# Patient Record
Sex: Female | Born: 1995 | Race: White | Hispanic: No | Marital: Single | Smoking: Current every day smoker
Health system: Southern US, Community
[De-identification: ages and names within clinical notes are randomized; demographics above are authoritative.]

---
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
## 2016-10-26 ENCOUNTER — Emergency Department
Admission: EM | Admit: 2016-10-26 | Discharge: 2016-10-26 | Disposition: A | Discharge status: Home / Self Care | Payer: Self-pay | Attending: Emergency Medicine | Admitting: Emergency Medicine

## 2016-10-26 ENCOUNTER — Emergency Department: Payer: Self-pay

## 2016-10-26 DIAGNOSIS — N10 Acute tubulo-interstitial nephritis: Secondary | ICD-10-CM | POA: Insufficient documentation

## 2016-10-26 LAB — CBC WITH DIFFERENTIAL/PLATELET
BASOS ABS: 0 10*3/uL (ref 0–0.1)
BASOS PCT: 0 %
EOS ABS: 0 10*3/uL (ref 0–0.7)
Eosinophils Relative: 0 %
HEMATOCRIT: 34.9 % — AB (ref 35.0–47.0)
Hemoglobin: 11.9 g/dL — ABNORMAL LOW (ref 12.0–16.0)
Lymphocytes Relative: 14 %
Lymphs Abs: 1.9 10*3/uL (ref 1.0–3.6)
MCH: 29.7 pg (ref 26.0–34.0)
MCHC: 34.1 g/dL (ref 32.0–36.0)
MCV: 87.3 fL (ref 80.0–100.0)
MONO ABS: 1.4 10*3/uL — AB (ref 0.2–0.9)
Monocytes Relative: 10 %
NEUTROS ABS: 10.7 10*3/uL — AB (ref 1.4–6.5)
NEUTROS PCT: 76 %
Platelets: 156 10*3/uL (ref 150–440)
RBC: 4 MIL/uL (ref 3.80–5.20)
RDW: 12.8 % (ref 11.5–14.5)
WBC: 14.1 10*3/uL — ABNORMAL HIGH (ref 3.6–11.0)

## 2016-10-26 LAB — URINALYSIS, ROUTINE W REFLEX MICROSCOPIC
Bilirubin Urine: NEGATIVE
Glucose, UA: NEGATIVE mg/dL
Hgb urine dipstick: NEGATIVE
KETONES UR: 20 mg/dL — AB
Nitrite: NEGATIVE
PROTEIN: 30 mg/dL — AB
Specific Gravity, Urine: 1.023 (ref 1.005–1.030)
pH: 5 (ref 5.0–8.0)

## 2016-10-26 LAB — BASIC METABOLIC PANEL
ANION GAP: 9 (ref 5–15)
BUN: 7 mg/dL (ref 6–20)
CALCIUM: 8.5 mg/dL — AB (ref 8.9–10.3)
CO2: 22 mmol/L (ref 22–32)
CREATININE: 0.65 mg/dL (ref 0.44–1.00)
Chloride: 102 mmol/L (ref 101–111)
Glucose, Bld: 86 mg/dL (ref 65–99)
Potassium: 3.2 mmol/L — ABNORMAL LOW (ref 3.5–5.1)
Sodium: 133 mmol/L — ABNORMAL LOW (ref 135–145)

## 2016-10-26 LAB — PREGNANCY, URINE: PREG TEST UR: NEGATIVE

## 2016-10-26 MED ORDER — SODIUM CHLORIDE 0.9 % IV BOLUS (SEPSIS)
1000.0000 mL | Freq: Once | INTRAVENOUS | Status: AC
  Administered 2016-10-26: 1000 mL via INTRAVENOUS

## 2016-10-26 MED ORDER — CIPROFLOXACIN HCL 500 MG PO TABS: 500.0000 mg | ORAL_TABLET | Freq: Two times a day (BID) | ORAL | 0 refills | Status: AC

## 2016-10-26 MED ORDER — NAPROXEN 500 MG PO TABS: 500.0000 mg | ORAL_TABLET | Freq: Two times a day (BID) | ORAL | 0 refills | Status: DC

## 2016-10-26 MED ORDER — SODIUM CHLORIDE 0.9 % IV BOLUS (SEPSIS): 500.0000 mL | Freq: Once | INTRAVENOUS | Status: DC

## 2016-10-26 NOTE — ED Notes (Signed)
Patient transported to CT 

## 2016-10-26 NOTE — ED Notes (Signed)
Pt states she has left sided flank pain for 4 days. Pt states she has had chills and nausea. Pt states no vaginal discharge and no known hematuria. Pt states pain is independent of movement. Skin warm and dry, resps unlabored. Cap refill less than 2 seconds.

## 2016-10-26 NOTE — ED Triage Notes (Signed)
Pt reports that she has lower abd pain that radiates into left ribs - pain has been present for 2 days - reports fever max 102.5 - c/o vomiting, chills, hot flashes, generalized headache

## 2016-10-26 NOTE — ED Provider Notes (Signed)
[facility] Emergency Department Provider Note  ____________________________________________   None    (approximate)  I have reviewed the triage vital signs and the nursing notes.   HISTORY  Chief Complaint Back Pain    HPI Makayla Roman is a 21 y.o. female presents with complaints of left flank pain 4-5 days. Positive vomiting and nausea times one day only. Denies any dietary needed. Sexually active with no form of birth control. Denies any vaginal discharge. Denies any bloody urine. Or dysuria.   History reviewed. No pertinent past medical history.  There are no active problems to display for this patient.   History reviewed. No pertinent surgical history.  Prior to Admission medications   Medication Sig Start Date End Date Taking? Authorizing Provider  ciprofloxacin (CIPRO) 500 MG tablet Take 1 tablet (500 mg total) by mouth 2 (two) times daily. 10/26/16 11/05/16  Charmayne Sheer Emeline Simpson, PA-C  naproxen (NAPROSYN) 500 MG tablet Take 1 tablet (500 mg total) by mouth 2 (two) times daily with a meal. 10/26/16   Evangeline Dakin, PA-C    Allergies Patient has no known allergies.  No family history on file.  Social History Social History  Substance Use Topics  . Smoking status: Never Smoker  . Smokeless tobacco: Never Used  . Alcohol use No    Review of Systems Constitutional: No fever/chills Eyes: No visual changes. ENT: No sore throat. Cardiovascular: Denies chest pain. Respiratory: Denies shortness of breath. Gastrointestinal: No abdominal pain.  No nausea, no vomiting.  No diarrhea.  No constipation. Genitourinary: Negative for dysuria. Musculoskeletal: Positive for left flank pain. Skin: Negative for rash. Neurological: Negative for headaches, focal weakness or numbness.  10-point ROS otherwise negative.  ____________________________________________   PHYSICAL EXAM:  VITAL SIGNS: ED Triage Vitals  Enc Vitals Group     BP  10/26/16 1656 101/67     Pulse Rate 10/26/16 1656 (!) 107     Resp 10/26/16 1656 16     Temp 10/26/16 1656 99.3 F (37.4 C)     Temp Source 10/26/16 1656 Oral     SpO2 10/26/16 1656 98 %     Weight 10/26/16 1657 130 lb (59 kg)     Height 10/26/16 1657 5' (1.524 m)     Head Circumference --      Peak Flow --      Pain Score 10/26/16 1657 5     Pain Loc --      Pain Edu? --      Excl. in GC? --     Constitutional: Alert and oriented. Well appearing and in no acute distress. Mouth/Throat: Mucous membranes are moist.  Oropharynx non-erythematous. Neck: No stridor.   Cardiovascular: Normal rate, regular rhythm. Grossly normal heart sounds.  Good peripheral circulation. Respiratory: Normal respiratory effort.  No retractions. Lungs CTAB. Gastrointestinal: Soft and nontender. No distention. No abdominal bruits. Positive left CVA tenderness. Musculoskeletal: No lower extremity tenderness nor edema.  No joint effusions. Neurologic:  Normal speech and language. No gross focal neurologic deficits are appreciated. No gait instability. Skin:  Skin is warm, dry and intact. No rash noted. Psychiatric: Mood and affect are normal. Speech and behavior are normal.  ____________________________________________   LABS (all labs ordered are listed, but only abnormal results are displayed)  Labs Reviewed  URINALYSIS, ROUTINE W REFLEX MICROSCOPIC - Abnormal; Notable for the following:       Result Value   Color, Urine AMBER (*)    APPearance CLEAR (*)  Ketones, ur 20 (*)    Protein, ur 30 (*)    Leukocytes, UA SMALL (*)    Bacteria, UA RARE (*)    Squamous Epithelial / LPF 0-5 (*)    All other components within normal limits  BASIC METABOLIC PANEL - Abnormal; Notable for the following:    Sodium 133 (*)    Potassium 3.2 (*)    Calcium 8.5 (*)    All other components within normal limits  CBC WITH DIFFERENTIAL/PLATELET - Abnormal; Notable for the following:    WBC 14.1 (*)    Hemoglobin  11.9 (*)    HCT 34.9 (*)    Neutro Abs 10.7 (*)    Monocytes Absolute 1.4 (*)    All other components within normal limits  URINE CULTURE  PREGNANCY, URINE   ____________________________________________  EKG   ____________________________________________  RADIOLOGY  Positive for mild renal calculi. Positive for inflammation secondary to UTI. ____________________________________________   PROCEDURES  Procedure(s) performed: None  Procedures  Critical Care performed: No  ____________________________________________   INITIAL IMPRESSION / ASSESSMENT AND PLAN / ED COURSE  Pertinent labs & imaging results that were available during my care of the patient were reviewed by me and considered in my medical decision making (see chart for details).  Acute urinary tract infection\pyelonephritis early. Rx given for Cipro 500 mg twice a day and ibuprofen 800 mg 3 times a day. Patient follow-up PCP or return to ER with any worsening symptomology. Patient voices no other emergency medical complaints at this time.      ____________________________________________   FINAL CLINICAL IMPRESSION(S) / ED DIAGNOSES  Final diagnoses:  Pyelonephritis, acute      NEW MEDICATIONS STARTED DURING THIS VISIT:  Discharge Medication List as of 10/26/2016  8:48 PM    START taking these medications   Details  ciprofloxacin (CIPRO) 500 MG tablet Take 1 tablet (500 mg total) by mouth 2 (two) times daily., Starting Fri 10/26/2016, Until Mon 11/05/2016, Print    naproxen (NAPROSYN) 500 MG tablet Take 1 tablet (500 mg total) by mouth 2 (two) times daily with a meal., Starting Fri 10/26/2016, Print         Note:  This document was prepared using Dragon voice recognition software and may include unintentional dictation errors.   Evangeline Dakinharles M Vung Kush, PA-C 10/26/16 2106    Myrna Blazeravid Matthew Schaevitz, MD 10/27/16 [phone]

## 2016-10-29 LAB — URINE CULTURE: SPECIAL REQUESTS: NORMAL

## 2017-06-04 IMAGING — CT CT RENAL STONE PROTOCOL
3 of 4 series · 10 of 46 positions shown, 15 images · non-contrast
Comparison: None.

CLINICAL DATA: Lower abdominal pain radiating to the left ribs
present for 2 days with fevers, vomiting, and chills. Leukocytosis.
White blood cells in urine.

EXAM:
CT ABDOMEN AND PELVIS WITHOUT CONTRAST
TECHNIQUE: Multidetector CT imaging of the abdomen and pelvis was performed
following the standard protocol without IV contrast.

[Series 4: lung bases · axial · 0.71mm/px · z∈[+885,+985]mm · 6 of 30 slices shown, 11 images]
[im 5/30  soft-tissue]
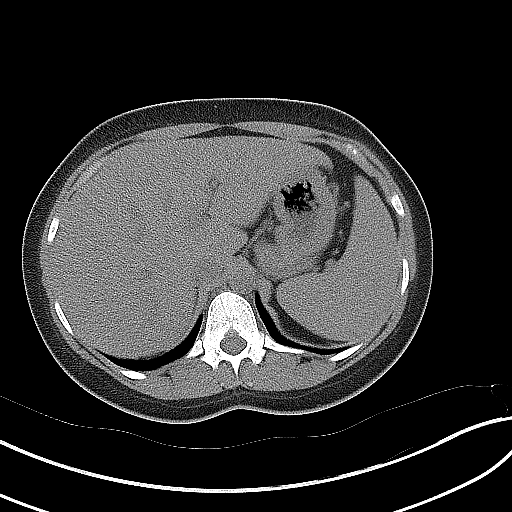
[im 5/30  bone]
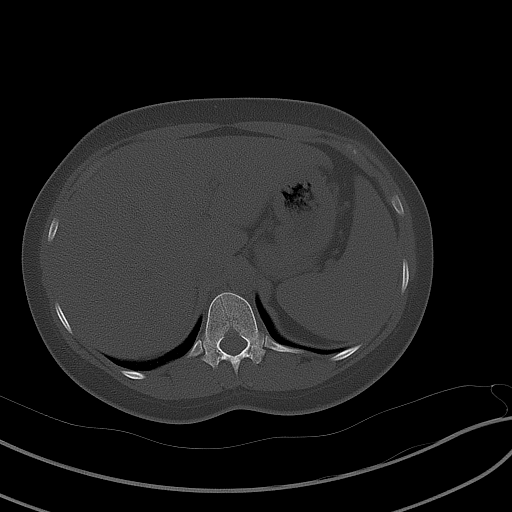
[im 9/30  soft-tissue]
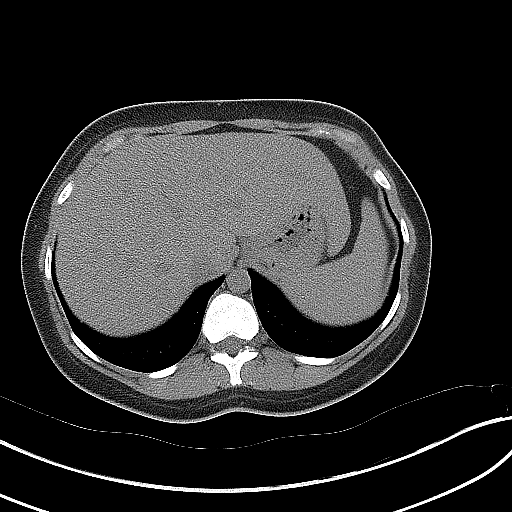
[im 13/30  soft-tissue]
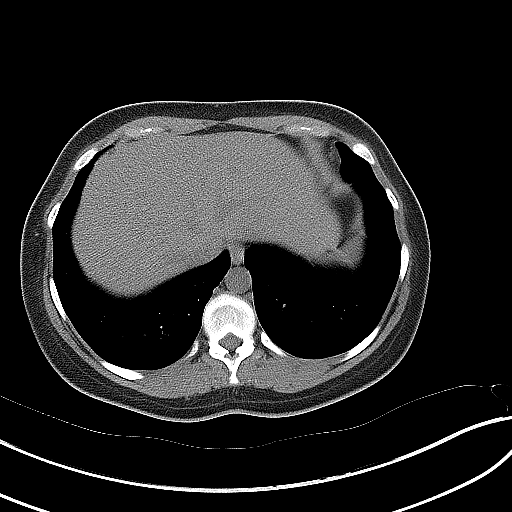
[im 13/30  lung]
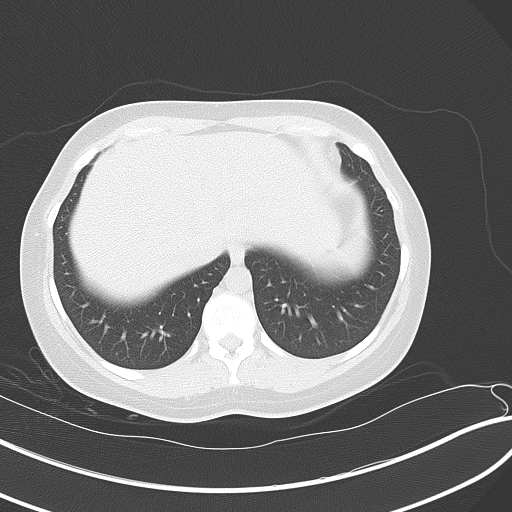
[im 17/30  soft-tissue]
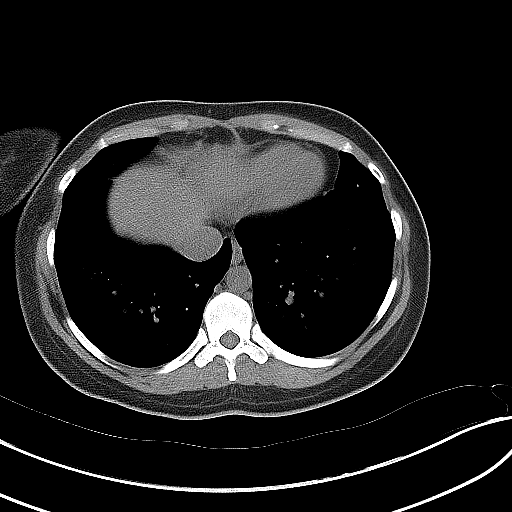
[im 17/30  lung]
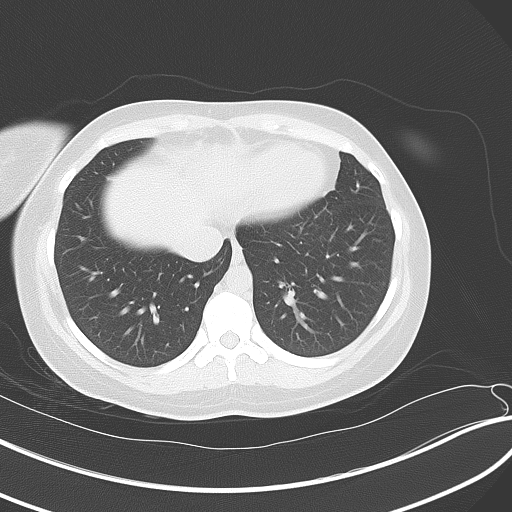
[im 21/30  soft-tissue]
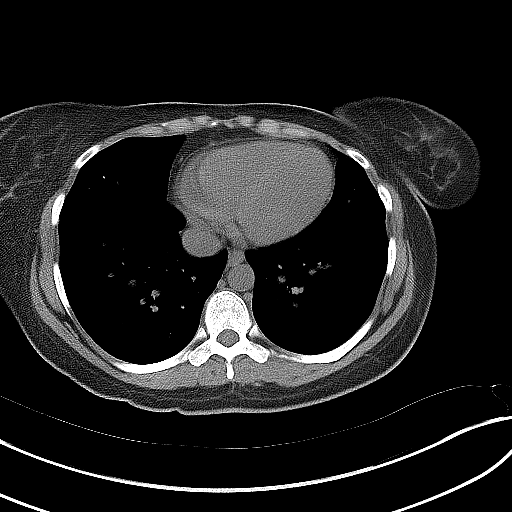
[im 21/30  lung]
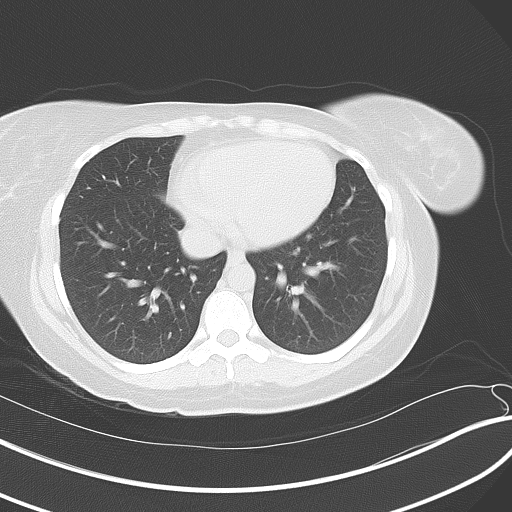
[im 25/30  soft-tissue]
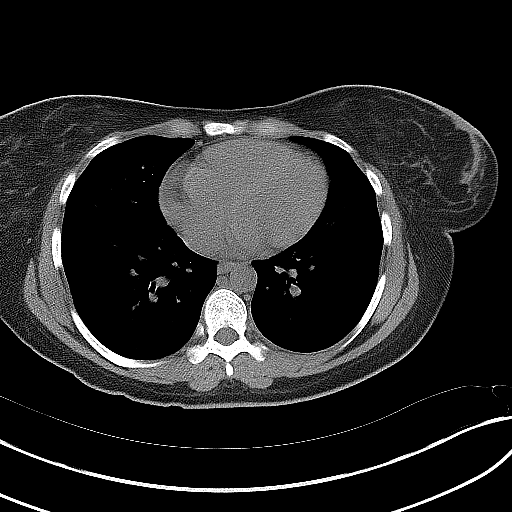
[im 25/30  lung]
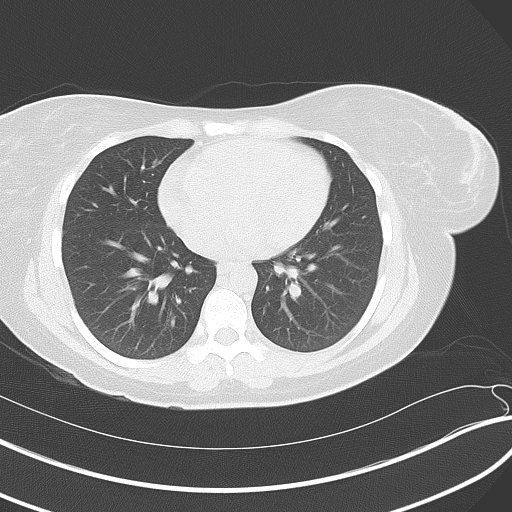

[Series 5: coronal · coronal · 0.65mm/px · 3 of 117 slices shown]
[im 39/117  soft-tissue]
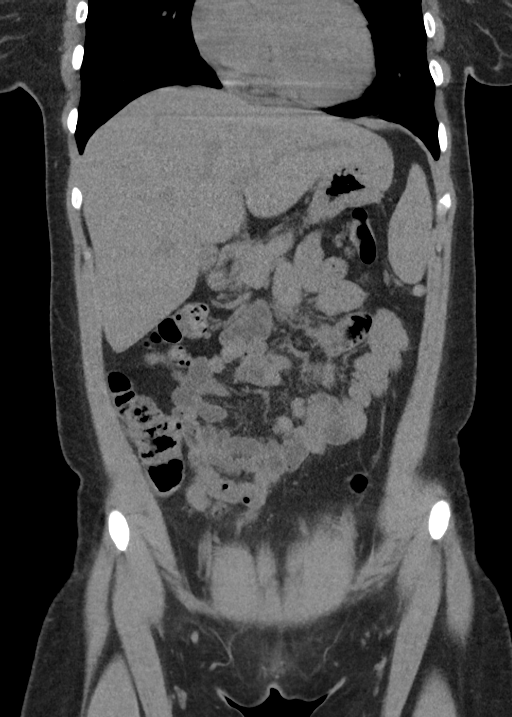
[im 52/117  soft-tissue]
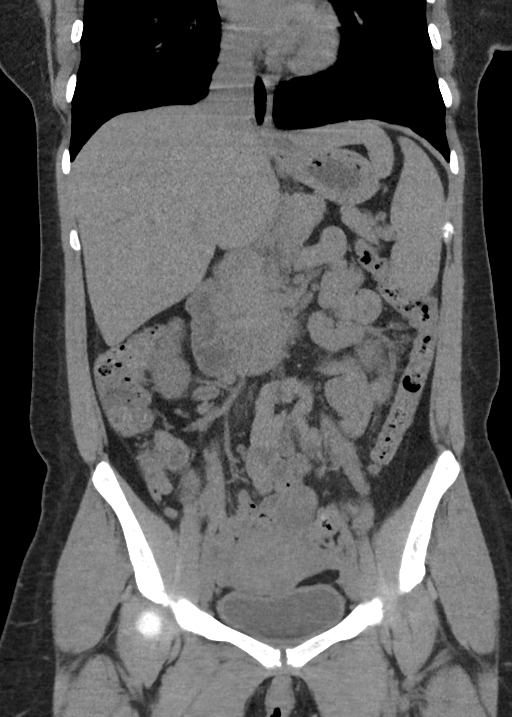
[im 65/117  soft-tissue]
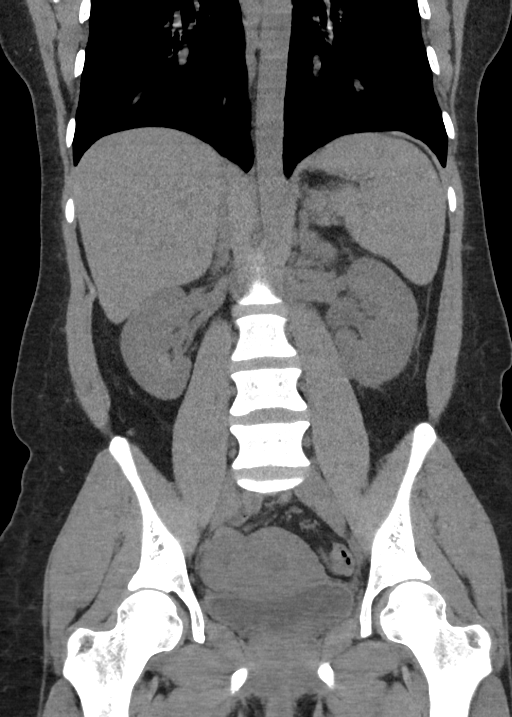

[Series 6: sagittal · sagittal · 0.53mm/px · 1 of 166 slices shown]
[im 56/166  soft-tissue]
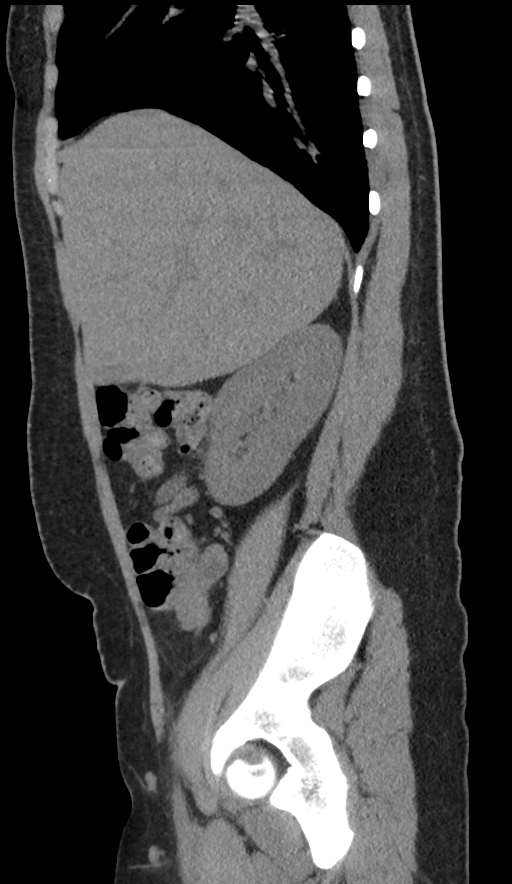

[10 of 46 positions shown; findings below may reference images not displayed]

FINDINGS: LOWER CHEST: Lung bases are clear. Included heart size is normal. No
pericardial effusion.

HEPATOBILIARY: Liver and gallbladder are normal.

PANCREAS: Normal.

SPLEEN: Normal.

ADRENALS/URINARY TRACT: There is mild asymmetric left renal
enlargement with perinephric fat stranding. No definite
nephrolithiasis although there is a punctate calculus in the left
hemipelvis either representing a small phlebolith or potentially a
tiny distal left ureteral stone. Ascending urinary tract infection
could also have this appearance of perinephric fat stranding such as
pyelonephritis and should be correlated. Urinary bladder is
partially distended and unremarkable. Normal adrenal glands.

STOMACH/BOWEL: The stomach, small and large bowel are normal in
course and caliber without inflammatory changes. Normal appendix.

VASCULAR/LYMPHATIC: Aortoiliac vessels are normal in course and
caliber. No lymphadenopathy by CT size criteria.

REPRODUCTIVE: Normal.

OTHER: No intraperitoneal free fluid or free air.

MUSCULOSKELETAL: Nonacute.
IMPRESSION: Mild left-sided nephromegaly with perinephric fat stranding
suspicious for urinary tract infection/pyelonephritis given history
of fever, leukocytosis, and wbc in urine. No definite obstructive
uropathy. There is a punctate calculus in the left hemipelvis which
could either represent a tiny phlebolith or ureteral stone though no
rbc in the urine. New no

## 2020-04-01 ENCOUNTER — Ambulatory Visit
Admission: RE | Admit: 2020-04-01 | Discharge: 2020-04-01 | Disposition: A | Discharge status: Home / Self Care | Payer: Medicaid Other | Source: Ambulatory Visit | Attending: Family Medicine | Admitting: Family Medicine

## 2020-04-01 ENCOUNTER — Other Ambulatory Visit: Payer: Self-pay

## 2020-04-01 VITALS — BP 103/69 | HR 108 | Temp 98.6°F | Resp 16

## 2020-04-01 DIAGNOSIS — R509 Fever, unspecified: Secondary | ICD-10-CM | POA: Diagnosis not present

## 2020-04-01 DIAGNOSIS — R05 Cough: Secondary | ICD-10-CM

## 2020-04-01 DIAGNOSIS — R0981 Nasal congestion: Secondary | ICD-10-CM | POA: Diagnosis not present

## 2020-04-01 DIAGNOSIS — H65193 Other acute nonsuppurative otitis media, bilateral: Secondary | ICD-10-CM

## 2020-04-01 DIAGNOSIS — R059 Cough, unspecified: Secondary | ICD-10-CM

## 2020-04-01 DIAGNOSIS — R000 Tachycardia, unspecified: Secondary | ICD-10-CM

## 2020-04-01 LAB — POC SARS CORONAVIRUS 2 AG -  ED: SARS Coronavirus 2 Ag: NEGATIVE

## 2020-04-01 MED ORDER — AMOXICILLIN 500 MG PO TABS: 500.0000 mg | ORAL_TABLET | Freq: Two times a day (BID) | ORAL | 0 refills | Status: AC

## 2020-04-01 NOTE — Discharge Instructions (Signed)
Your COVID test is pending.  You should self quarantine until the test result is back.    Take Tylenol as needed for fever or discomfort.  Rest and keep yourself hydrated.    Go to the emergency department if you develop shortness of breath, severe diarrhea, high fever not relieved by Tylenol or ibuprofen, or other concerning symptoms.    You have an ear infection.  I have sent in amoxicillin to your pharmacy. If you are not feeling better over the next 2 days, follow up with primary care or with this office

## 2020-04-01 NOTE — ED Triage Notes (Signed)
C/o productive cough, congestion, and fever x4 days.

## 2020-04-01 NOTE — ED Provider Notes (Signed)
[facility]   [id number] 04/01/20 Arrival Time: 1103   CC: COVID symptoms  SUBJECTIVE: History from: patient.  Makayla Roman is a 24 y.o. female who presents with abrupt onset of nasal congestion, PND, fever, bilateral ear pain, productive cough for the last 4 days. Denies sick exposure to COVID, flu or strep. Denies recent travel. Has tried ibuprofen with temporary fever relief. There are no aggravating symptoms. Denies previous symptoms in the past.   Denies sinus pain, rhinorrhea, sore throat, SOB, wheezing, chest pain, nausea, changes in bowel or bladder habits.    ROS: As per HPI.  All other pertinent ROS negative.     History reviewed. No pertinent past medical history. History reviewed. No pertinent surgical history. No Known Allergies No current facility-administered medications on file prior to encounter.   Current Outpatient Medications on File Prior to Encounter  Medication Sig Dispense Refill   naproxen (NAPROSYN) 500 MG tablet Take 1 tablet (500 mg total) by mouth 2 (two) times daily with a meal. 60 tablet 0   Social History   Socioeconomic History   Marital status: Single    Spouse name: Not on file   Number of children: Not on file   Years of education: Not on file   Highest education level: Not on file  Occupational History   Not on file  Tobacco Use   Smoking status: Current Every Day Smoker    Packs/day: 1.00   Smokeless tobacco: Never Used  Substance and Sexual Activity   Alcohol use: No   Drug use: Not on file   Sexual activity: Not on file  Other Topics Concern   Not on file  Social History Narrative   Not on file   Social Determinants of Health   Financial Resource Strain:    Difficulty of Paying Living Expenses:   Food Insecurity:    Worried About Charity fundraiser in the Last Year:    Arboriculturist in the Last Year:   Transportation Needs:    Film/video editor (Medical):    Lack of  Transportation (Non-Medical):   Physical Activity:    Days of Exercise per Week:    Minutes of Exercise per Session:   Stress:    Feeling of Stress :   Social Connections:    Frequency of Communication with Friends and Family:    Frequency of Social Gatherings with Friends and Family:    Attends Religious Services:    Active Member of Clubs or Organizations:    Attends Music therapist:    Marital Status:   Intimate Partner Violence:    Fear of Current or Ex-Partner:    Emotionally Abused:    Physically Abused:    Sexually Abused:    No family history on file.  OBJECTIVE:  Vitals:   04/01/20 1112  BP: 103/69  Pulse: (!) 108  Resp: 16  Temp: 98.6 F (37 C)  SpO2: 94%     General appearance: alert; appears fatigued, but nontoxic; speaking in full sentences and tolerating own secretions HEENT: NCAT; Ears: EACs clear, bilateral TMs erythematous, bulging, with effusion; Eyes: PERRL.  EOM grossly intact. Sinuses: nontender; Nose: nares patent without rhinorrhea, Throat: oropharynx clear, tonsils non erythematous or enlarged, uvula midline  Neck: supple without LAD Lungs: unlabored respirations, symmetrical air entry; cough: moderate; no respiratory distress; CTAB Heart: regular rate and rhythm.  Radial pulses 2+ symmetrical bilaterally Skin: warm and dry Psychological: alert and cooperative; normal mood and affect  LABS:  Results for orders placed or performed during the hospital encounter of 04/01/20 (from the past 24 hour(s))  POC SARS Coronavirus 2 Ag-ED - Nasal Swab (BD Veritor Kit)     Status: None   Collection Time: 04/01/20 11:41 AM  Result Value Ref Range   SARS Coronavirus 2 Ag Negative Negative     ASSESSMENT & PLAN:  1. Other non-recurrent acute nonsuppurative otitis media of both ears   2. Cough   3. Nasal congestion   4. Fever, unspecified fever cause   5. Tachycardia     Meds ordered this encounter  Medications    amoxicillin (AMOXIL) 500 MG tablet    Sig: Take 1 tablet (500 mg total) by mouth 2 (two) times daily for 10 days.    Dispense:  20 tablet    Refill:  0    Order Specific Question:   Supervising Provider    Answer:   Chase Picket [id number]   Bilateral OM Cough Nasal Congestion Fever Tachycardia  Prescribed Amoxicillin Take as directed and to completion Rapid Covid negative Send out Covid ordered It will take between 1-2 days for test results.   Someone will contact you regarding abnormal results.    Patient should remain in quarantine until they have received Covid results.  If negative you may resume normal activities (go back to work/school) while practicing hand hygiene, social distance, and mask wearing.  If positive, patient should remain in quarantine for 10 days from symptom onset AND greater than 72 hours after symptoms resolution (absence of fever without the use of fever-reducing medication and improvement in respiratory symptoms), whichever is longer Get plenty of rest and push fluids Use OTC zyrtec for nasal congestion, runny nose, and/or sore throat Use OTC flonase for nasal congestion and runny nose Use medications daily for symptom relief Use OTC medications like ibuprofen or tylenol as needed fever or pain Call or go to the ED if you have any new or worsening symptoms such as fever, worsening cough, shortness of breath, chest tightness, chest pain, turning blue, changes in mental status.  Reviewed expectations re: course of current medical issues. Questions answered. Outlined signs and symptoms indicating need for more acute intervention. Patient verbalized understanding. After Visit Summary given.         Faustino Congress, NP 04/01/20 1720

## 2020-04-02 LAB — SARS-COV-2, NAA 2 DAY TAT

## 2020-04-02 LAB — NOVEL CORONAVIRUS, NAA: SARS-CoV-2, NAA: NOT DETECTED

## 2020-10-25 ENCOUNTER — Other Ambulatory Visit: Payer: Self-pay

## 2020-10-25 ENCOUNTER — Ambulatory Visit
Admission: EM | Admit: 2020-10-25 | Discharge: 2020-10-25 | Disposition: A | Discharge status: Home / Self Care | Payer: Medicaid Other | Attending: Family Medicine | Admitting: Family Medicine

## 2020-10-25 DIAGNOSIS — N3001 Acute cystitis with hematuria: Secondary | ICD-10-CM | POA: Diagnosis present

## 2020-10-25 DIAGNOSIS — R1030 Lower abdominal pain, unspecified: Secondary | ICD-10-CM | POA: Insufficient documentation

## 2020-10-25 LAB — POCT URINALYSIS DIP (MANUAL ENTRY)
Bilirubin, UA: NEGATIVE
Blood, UA: NEGATIVE
Glucose, UA: NEGATIVE mg/dL
Ketones, POC UA: NEGATIVE mg/dL
Nitrite, UA: POSITIVE — AB
Protein Ur, POC: NEGATIVE mg/dL
Spec Grav, UA: >=1.03 — AB (ref 1.010–1.025)
Urobilinogen, UA: 0.2 E.U./dL
pH, UA: 5.5 (ref 5.0–8.0)

## 2020-10-25 LAB — POCT URINE PREGNANCY: Preg Test, Ur: NEGATIVE

## 2020-10-25 MED ORDER — NITROFURANTOIN MONOHYD MACRO 100 MG PO CAPS: 100.0000 mg | ORAL_CAPSULE | Freq: Two times a day (BID) | ORAL | 0 refills | Status: DC

## 2020-10-25 NOTE — ED Provider Notes (Signed)
Renaldo Fiddler    CSN: [id number] Arrival date & time: 10/25/20  0856      History   Chief Complaint Chief Complaint  Patient presents with  . Abdominal Pain    HPI Makayla Roman is a 25 y.o. female.   Patient is a 25 year old female who presents today with complaints of lower abdominal discomfort.  This is more to the suprapubic area and right pelvic area.  Describes the pain as deep, sharp that started this morning during a bowel movement.  After bowel movement the pain subsided within 10 to 15 minutes.  Mild pain at this time.  No fevers, flank pain, dysuria, hematuria or urinary frequency.  No vaginal discharge or concern for STDs. Patient's last menstrual period was 10/04/2020.      History reviewed. No pertinent past medical history.  There are no problems to display for this patient.   Past Surgical History:  Procedure Laterality Date  . CESAREAN SECTION  2016    OB History   No obstetric history on file.      Home Medications    Prior to Admission medications   Medication Sig Start Date End Date Taking? Authorizing Provider  nitrofurantoin, macrocrystal-monohydrate, (MACROBID) 100 MG capsule Take 1 capsule (100 mg total) by mouth 2 (two) times daily. 10/25/20  Yes Janace Aris, NP    Family History Family History  Adopted: Yes  Family history unknown: Yes    Social History Social History   Tobacco Use  . Smoking status: Current Every Day Smoker    Packs/day: 1.00  . Smokeless tobacco: Never Used  Vaping Use  . Vaping Use: Never used  Substance Use Topics  . Alcohol use: No  . Drug use: Never     Allergies   Patient has no known allergies.   Review of Systems Review of Systems   Physical Exam Triage Vital Signs ED Triage Vitals  Enc Vitals Group     BP 10/25/20 0910 108/73     Pulse Rate 10/25/20 0910 80     Resp 10/25/20 0910 18     Temp 10/25/20 0910 98.2 F (36.8 C)     Temp Source 10/25/20 0910 Oral      SpO2 10/25/20 0910 98 %     Weight --      Height --      Head Circumference --      Peak Flow --      Pain Score 10/25/20 0908 2     Pain Loc --      Pain Edu? --      Excl. in GC? --    No data found.  Updated Vital Signs BP 108/73 (BP Location: Left Arm)   Pulse 80   Temp 98.2 F (36.8 C) (Oral)   Resp 18   LMP 10/04/2020   SpO2 98%   Visual Acuity Right Eye Distance:   Left Eye Distance:   Bilateral Distance:    Right Eye Near:   Left Eye Near:    Bilateral Near:     Physical Exam Vitals and nursing note reviewed.  Constitutional:      General: She is not in acute distress.    Appearance: Normal appearance. She is not ill-appearing, toxic-appearing or diaphoretic.  HENT:     Head: Normocephalic.  Eyes:     Conjunctiva/sclera: Conjunctivae normal.  Pulmonary:     Effort: Pulmonary effort is normal.  Abdominal:     General: There is no distension.  Palpations: Abdomen is soft.     Tenderness: There is abdominal tenderness. There is no guarding or rebound.       Comments: TTP  Musculoskeletal:        General: Normal range of motion.     Cervical back: Normal range of motion.  Skin:    General: Skin is warm and dry.     Findings: No rash.  Neurological:     Mental Status: She is alert.  Psychiatric:        Mood and Affect: Mood normal.      UC Treatments / Results  Labs (all labs ordered are listed, but only abnormal results are displayed) Labs Reviewed  POCT URINALYSIS DIP (MANUAL ENTRY) - Abnormal; Notable for the following components:      Result Value   Clarity, UA cloudy (*)    Spec Grav, UA >=1.030 (*)    Nitrite, UA Positive (*)    Leukocytes, UA Trace (*)    All other components within normal limits  URINE CULTURE  POCT URINE PREGNANCY    EKG   Radiology No results found.  Procedures Procedures (including critical care time)  Medications Ordered in UC Medications - No data to display  Initial Impression / Assessment  and Plan / UC Course  I have reviewed the triage vital signs and the nursing notes.  Pertinent labs & imaging results that were available during my care of the patient were reviewed by me and considered in my medical decision making (see chart for details).     Lower abdominal pain with acute cystitis Urine with trace leuks, positive nitrates and cloudy Treating with Macrobid.  Sending for culture.  Recommend push fluids. Follow up as needed for continued or worsening symptoms  Final Clinical Impressions(s) / UC Diagnoses   Final diagnoses:  Lower abdominal pain  Acute cystitis with hematuria     Discharge Instructions     You have a urinary tract infection Take the antibiotics as prescribed Drink plenty of water.  Follow up as needed for continued or worsening symptoms      ED Prescriptions    Medication Sig Dispense Auth. Provider   nitrofurantoin, macrocrystal-monohydrate, (MACROBID) 100 MG capsule Take 1 capsule (100 mg total) by mouth 2 (two) times daily. 10 capsule Dahlia Byes A, NP     PDMP not reviewed this encounter.   Janace Aris, NP 10/25/20 1004

## 2020-10-25 NOTE — ED Triage Notes (Signed)
Pt reports with deep, sharp abdominal pain starting this morning during BM.  Lower abdominal/uterine area.  No fevers, +TTP, no discharge, no urinary s/s.  No concern for STDs. Pain started easing about 10-15 min after BM.

## 2020-10-25 NOTE — Discharge Instructions (Addendum)
You have a urinary tract infection Take the antibiotics as prescribed Drink plenty of water.  Follow up as needed for continued or worsening symptoms

## 2020-10-27 LAB — URINE CULTURE: Culture: >=100000 — AB

## 2020-10-28 ENCOUNTER — Ambulatory Visit
Admission: EM | Admit: 2020-10-28 | Discharge: 2020-10-28 | Disposition: A | Discharge status: Home / Self Care | Payer: Medicaid Other | Attending: Family Medicine | Admitting: Family Medicine

## 2020-10-28 ENCOUNTER — Other Ambulatory Visit: Payer: Self-pay

## 2020-10-28 DIAGNOSIS — Z1152 Encounter for screening for COVID-19: Secondary | ICD-10-CM

## 2020-10-28 NOTE — ED Triage Notes (Signed)
Pt reports having fever (99.0-101.0), fatigue, body aches, congestion x 3 days. She reports exposed to covid 9 days ago. Pt requesting covid test only.

## 2020-10-30 LAB — NOVEL CORONAVIRUS, NAA: SARS-CoV-2, NAA: DETECTED — AB

## 2020-10-30 LAB — SARS-COV-2, NAA 2 DAY TAT

## 2020-11-03 ENCOUNTER — Other Ambulatory Visit: Payer: Medicaid Other

## 2020-11-03 ENCOUNTER — Ambulatory Visit: Payer: Self-pay

## 2022-10-18 ENCOUNTER — Ambulatory Visit
Admission: RE | Admit: 2022-10-18 | Discharge: 2022-10-18 | Disposition: A | Discharge status: Home / Self Care | Payer: Medicaid Other | Source: Ambulatory Visit | Attending: Urgent Care | Admitting: Urgent Care

## 2022-10-18 VITALS — BP 111/74 | HR 113 | Temp 100.4°F | Resp 16

## 2022-10-18 DIAGNOSIS — K1121 Acute sialoadenitis: Secondary | ICD-10-CM | POA: Diagnosis not present

## 2022-10-18 MED ORDER — AMOXICILLIN-POT CLAVULANATE 875-125 MG PO TABS: 1.0000 | ORAL_TABLET | Freq: Two times a day (BID) | ORAL | 0 refills | Status: DC

## 2022-10-18 NOTE — ED Triage Notes (Signed)
Pt. Presents to UC w/ c/o right ear swelling and pain for the past 3 days.

## 2022-10-18 NOTE — ED Provider Notes (Addendum)
Roderic Palau    CSN: [id number] Arrival date & time: 10/18/22  1558      History   Chief Complaint Chief Complaint  Patient presents with   Otalgia    HPI Makayla Roman is a 27 y.o. female.    Otalgia   Patient presents to urgent care with complaint of right ear swelling and pain x 3 days.  She presents with elevated temperature of 100.4 F and elevated heart rate of 113 bpm.  She endorses jaw pain when she chews as well as pain on the external face below her ear with palpation.  History reviewed. No pertinent past medical history.  There are no problems to display for this patient.   Past Surgical History:  Procedure Laterality Date   CESAREAN SECTION  2016    OB History   No obstetric history on file.      Home Medications    Prior to Admission medications   Medication Sig Start Date End Date Taking? Authorizing Provider  nitrofurantoin, macrocrystal-monohydrate, (MACROBID) 100 MG capsule Take 1 capsule (100 mg total) by mouth 2 (two) times daily. 10/25/20   Orvan July, NP    Family History Family History  Adopted: Yes  Family history unknown: Yes    Social History Social History   Tobacco Use   Smoking status: Every Day    Packs/day: 1.00    Types: Cigarettes   Smokeless tobacco: Never  Vaping Use   Vaping Use: Never used  Substance Use Topics   Alcohol use: No   Drug use: Never     Allergies   Patient has no known allergies.   Review of Systems Review of Systems  HENT:  Positive for ear pain.      Physical Exam Triage Vital Signs ED Triage Vitals  Enc Vitals Group     BP 10/18/22 1626 111/74     Pulse Rate 10/18/22 1626 (!) 113     Resp 10/18/22 1626 16     Temp 10/18/22 1626 (!) 100.4 F (38 C)     Temp src --      SpO2 10/18/22 1626 96 %     Weight --      Height --      Head Circumference --      Peak Flow --      Pain Score 10/18/22 1627 4     Pain Loc --      Pain Edu? --      Excl. in [place]? --     No data found.  Updated Vital Signs BP 111/74   Pulse (!) 113   Temp (!) 100.4 F (38 C)   Resp 16   LMP 10/07/2022 (Exact Date)   SpO2 96%   Visual Acuity Right Eye Distance:   Left Eye Distance:   Bilateral Distance:    Right Eye Near:   Left Eye Near:    Bilateral Near:     Physical Exam Vitals reviewed.  Constitutional:      Appearance: Normal appearance.  HENT:     Head: Normocephalic.     Jaw: Tenderness, swelling and pain on movement present.      Ears:     Comments: Right EAC is erythematous near the TM.  TM appears WNL. Skin:    General: Skin is warm and dry.  Neurological:     General: No focal deficit present.     Mental Status: She is alert and oriented to person, place, and  time.  Psychiatric:        Mood and Affect: Mood normal.        Behavior: Behavior normal.      UC Treatments / Results  Labs (all labs ordered are listed, but only abnormal results are displayed) Labs Reviewed - No data to display  EKG   Radiology No results found.  Procedures Procedures (including critical care time)  Medications Ordered in UC Medications - No data to display  Initial Impression / Assessment and Plan / UC Course  I have reviewed the triage vital signs and the nursing notes.  Pertinent labs & imaging results that were available during my care of the patient were reviewed by me and considered in my medical decision making (see chart for details).   Symptoms, including fever, are consistent with infection of the salivary gland on the right side of her face.  She reports no symptoms that would imply sinusitis. Will treat empirically with antibiotic augmentin.   Final Clinical Impressions(s) / UC Diagnoses   Final diagnoses:  None   Discharge Instructions   None    ED Prescriptions   None    PDMP not reviewed this encounter.   Rose Phi, [place] 10/18/22 1652    Rose Phi, [place] 10/18/22 1655

## 2022-10-18 NOTE — Discharge Instructions (Addendum)
Follow up here or with your primary care provider if your symptoms are worsening or not improving with treatment.     

## 2024-10-21 ENCOUNTER — Emergency Department

## 2024-10-21 ENCOUNTER — Emergency Department
Admission: EM | Admit: 2024-10-21 | Discharge: 2024-10-21 | Disposition: A | Discharge status: Home / Self Care | Attending: Emergency Medicine | Admitting: Emergency Medicine

## 2024-10-21 DIAGNOSIS — J101 Influenza due to other identified influenza virus with other respiratory manifestations: Secondary | ICD-10-CM | POA: Insufficient documentation

## 2024-10-21 DIAGNOSIS — J181 Lobar pneumonia, unspecified organism: Secondary | ICD-10-CM | POA: Insufficient documentation

## 2024-10-21 DIAGNOSIS — R197 Diarrhea, unspecified: Secondary | ICD-10-CM | POA: Insufficient documentation

## 2024-10-21 DIAGNOSIS — J189 Pneumonia, unspecified organism: Secondary | ICD-10-CM

## 2024-10-21 DIAGNOSIS — R112 Nausea with vomiting, unspecified: Secondary | ICD-10-CM

## 2024-10-21 DIAGNOSIS — E876 Hypokalemia: Secondary | ICD-10-CM | POA: Diagnosis not present

## 2024-10-21 DIAGNOSIS — D72829 Elevated white blood cell count, unspecified: Secondary | ICD-10-CM | POA: Diagnosis not present

## 2024-10-21 DIAGNOSIS — R059 Cough, unspecified: Secondary | ICD-10-CM | POA: Diagnosis present

## 2024-10-21 LAB — CBC WITH DIFFERENTIAL/PLATELET
Abs Immature Granulocytes: 0.08 K/uL — ABNORMAL HIGH (ref 0.00–0.07)
Basophils Absolute: 0 K/uL (ref 0.0–0.1)
Basophils Relative: 0 %
Eosinophils Absolute: 0 K/uL (ref 0.0–0.5)
Eosinophils Relative: 0 %
HCT: 38.6 % (ref 36.0–46.0)
Hemoglobin: 14.1 g/dL (ref 12.0–15.0)
Immature Granulocytes: 1 %
Lymphocytes Relative: 14 %
Lymphs Abs: 2 K/uL (ref 0.7–4.0)
MCH: 31.7 pg (ref 26.0–34.0)
MCHC: 36.5 g/dL — ABNORMAL HIGH (ref 30.0–36.0)
MCV: 86.7 fL (ref 80.0–100.0)
Monocytes Absolute: 1 K/uL (ref 0.1–1.0)
Monocytes Relative: 7 %
Neutro Abs: 11.6 K/uL — ABNORMAL HIGH (ref 1.7–7.7)
Neutrophils Relative %: 78 %
Platelets: 112 K/uL — ABNORMAL LOW (ref 150–400)
RBC: 4.45 MIL/uL (ref 3.87–5.11)
RDW: 12.3 % (ref 11.5–15.5)
Smear Review: NORMAL
WBC: 15 K/uL — ABNORMAL HIGH (ref 4.0–10.5)
nRBC: 0 % (ref 0.0–0.2)

## 2024-10-21 LAB — COMPREHENSIVE METABOLIC PANEL WITH GFR
ALT: 18 U/L (ref 0–44)
AST: 19 U/L (ref 15–41)
Albumin: 4.1 g/dL (ref 3.5–5.0)
Alkaline Phosphatase: 70 U/L (ref 38–126)
Anion gap: 17 — ABNORMAL HIGH (ref 5–15)
BUN: 8 mg/dL (ref 6–20)
CO2: 22 mmol/L (ref 22–32)
Calcium: 9.9 mg/dL (ref 8.9–10.3)
Chloride: 94 mmol/L — ABNORMAL LOW (ref 98–111)
Creatinine, Ser: 0.58 mg/dL (ref 0.44–1.00)
GFR, Estimated: >60 mL/min
Glucose, Bld: 97 mg/dL (ref 70–99)
Potassium: 2.9 mmol/L — ABNORMAL LOW (ref 3.5–5.1)
Sodium: 132 mmol/L — ABNORMAL LOW (ref 135–145)
Total Bilirubin: 0.9 mg/dL (ref 0.0–1.2)
Total Protein: 7.6 g/dL (ref 6.5–8.1)

## 2024-10-21 LAB — RESP PANEL BY RT-PCR (RSV, FLU A&B, COVID)  RVPGX2
Influenza A by PCR: POSITIVE — AB
Influenza B by PCR: NEGATIVE
Resp Syncytial Virus by PCR: NEGATIVE
SARS Coronavirus 2 by RT PCR: NEGATIVE

## 2024-10-21 LAB — LIPASE, BLOOD: Lipase: 16 U/L (ref 11–51)

## 2024-10-21 MED ORDER — POTASSIUM CHLORIDE 10 MEQ/100ML IV SOLN
10.0000 meq | Freq: Once | INTRAVENOUS | Status: DC
  Filled 2024-10-21: qty 100

## 2024-10-21 MED ORDER — ONDANSETRON 4 MG PO TBDP
4.0000 mg | ORAL_TABLET | Freq: Once | ORAL | Status: AC
  Administered 2024-10-21: 4 mg via ORAL
  Filled 2024-10-21: qty 1

## 2024-10-21 MED ORDER — AZITHROMYCIN 250 MG PO TABS: ORAL_TABLET | ORAL | 0 refills | Status: AC

## 2024-10-21 MED ORDER — CEFDINIR 300 MG PO CAPS: 300.0000 mg | ORAL_CAPSULE | Freq: Two times a day (BID) | ORAL | 0 refills | Status: AC

## 2024-10-21 MED ORDER — SODIUM CHLORIDE 0.9 % IV BOLUS
1000.0000 mL | Freq: Once | INTRAVENOUS | Status: AC
  Administered 2024-10-21: 1000 mL via INTRAVENOUS

## 2024-10-21 MED ORDER — POTASSIUM CHLORIDE 20 MEQ PO PACK
40.0000 meq | PACK | Freq: Two times a day (BID) | ORAL | Status: DC
  Administered 2024-10-21: 40 meq via ORAL
  Filled 2024-10-21: qty 2

## 2024-10-21 NOTE — ED Provider Triage Note (Signed)
 Emergency Medicine Provider Triage Evaluation Note  Makayla Roman , a 29 y.o. female  was evaluated in triage.  Pt complains of nausea, vomiting and diarrhea. Fever, chills.  Recently prescribed amoxil  for otitis.   Review of Systems  Positive: Vomiting, diarrhea, otitis.  Negative: No abd pain  Physical Exam  BP 129/76   Pulse 87   Temp 98.2 F (36.8 C) (Oral)   Resp 18   Ht 5' (1.524 m)   Wt 61.2 kg   LMP 10/18/2024 (Approximate)   SpO2 95%   BMI 26.37 kg/m  Gen:   Awake, no distress  alert Resp:  Normal effort clear billaterally MSK:   Moves extremities without difficulty  Other:    Medical Decision Making  Medically screening exam initiated at 10:23 AM.  Appropriate orders placed.  Makayla Roman was informed that the remainder of the evaluation will be completed by another provider, this initial triage assessment does not replace that evaluation, and the importance of remaining in the ED until their evaluation is complete.     Saunders Shona CROME, PA-C 10/21/24 1025

## 2024-10-21 NOTE — ED Provider Notes (Signed)
 "  [facility] Provider Note    Event Date/Time   First MD Initiated Contact with Patient 10/21/24 1029     (approximate)   History   Emesis   HPI  Makayla Roman is a 29 y.o. female who presents today for evaluation of nausea, vomiting, diarrhea for the past 6 days.  Reports that her husband was sick with similar symptoms, but is improved.  Also reports that her daughter has a cough.  She has not been tested for the flu.  No abdominal pain.  No burning with urination.  There are no active problems to display for this patient.         Physical Exam   Triage Vital Signs: ED Triage Vitals  Encounter Vitals Group     BP 10/21/24 1018 129/76     Girls Systolic BP Percentile --      Girls Diastolic BP Percentile --      Boys Systolic BP Percentile --      Boys Diastolic BP Percentile --      Pulse Rate 10/21/24 1018 87     Resp 10/21/24 1018 18     Temp 10/21/24 1018 98.2 F (36.8 C)     Temp Source 10/21/24 1018 Oral     SpO2 10/21/24 1018 95 %     Weight 10/21/24 1020 135 lb (61.2 kg)     Height 10/21/24 1020 5' (1.524 m)     Head Circumference --      Peak Flow --      Pain Score 10/21/24 1019 4     Pain Loc --      Pain Education --      Exclude from Growth Chart --     Most recent vital signs: Vitals:   10/21/24 1018 10/21/24 1355  BP: 129/76 108/69  Pulse: 87 86  Resp: 18 16  Temp: 98.2 F (36.8 C) 98.3 F (36.8 C)  SpO2: 95% 94%    Physical Exam Vitals and nursing note reviewed.  Constitutional:      General: Awake and alert. No acute distress.    Appearance: Normal appearance. The patient is normal weight.  HENT:     Head: Normocephalic and atraumatic.     Mouth: Mucous membranes are moist.  Eyes:     General: PERRL. Normal EOMs        Right eye: No discharge.        Left eye: No discharge.     Conjunctiva/sclera: Conjunctivae normal.  Cardiovascular:     Rate and Rhythm: Normal rate and regular rhythm.     Pulses:  Normal pulses.  Pulmonary:     Effort: Pulmonary effort is normal. No respiratory distress.  No tachypnea.  Able to speak easily in complete sentences.  No accessory muscle use.    Breath sounds: Rhonchi noted bilaterally Abdominal:     Abdomen is soft. There is no abdominal tenderness. No rebound or guarding. No distention. Musculoskeletal:        General: No swelling. Normal range of motion.     Cervical back: Normal range of motion and neck supple.  Skin:    General: Skin is warm and dry.     Capillary Refill: Capillary refill takes less than 2 seconds.     Findings: No rash.  Neurological:     Mental Status: The patient is awake and alert.      ED Results / Procedures / Treatments   Labs (all labs ordered  are listed, but only abnormal results are displayed) Labs Reviewed  RESP PANEL BY RT-PCR (RSV, FLU A&B, COVID)  RVPGX2 - Abnormal; Notable for the following components:      Result Value   Influenza A by PCR POSITIVE (*)    All other components within normal limits  CBC WITH DIFFERENTIAL/PLATELET - Abnormal; Notable for the following components:   WBC 15.0 (*)    MCHC 36.5 (*)    Platelets 112 (*)    Neutro Abs 11.6 (*)    Abs Immature Granulocytes 0.08 (*)    All other components within normal limits  COMPREHENSIVE METABOLIC PANEL WITH GFR - Abnormal; Notable for the following components:   Sodium 132 (*)    Potassium 2.9 (*)    Chloride 94 (*)    Anion gap 17 (*)    All other components within normal limits  LIPASE, BLOOD  URINALYSIS, ROUTINE W REFLEX MICROSCOPIC  POC URINE PREG, ED     EKG     RADIOLOGY I independently reviewed and interpreted imaging and agree with radiologists findings.     PROCEDURES:  Critical Care performed:   Procedures   MEDICATIONS ORDERED IN ED: Medications  potassium chloride  10 mEq in 100 mL IVPB (10 mEq Intravenous Not Given 10/21/24 1355)  potassium chloride  (KLOR-CON ) packet 40 mEq (40 mEq Oral Given 10/21/24  1303)  ondansetron  (ZOFRAN -ODT) disintegrating tablet 4 mg (4 mg Oral Given 10/21/24 1024)  sodium chloride  0.9 % bolus 1,000 mL (0 mLs Intravenous Stopped 10/21/24 1251)     IMPRESSION / MDM / ASSESSMENT AND PLAN / ED COURSE  I reviewed the triage vital signs and the nursing notes.   Differential diagnosis includes, but is not limited to, influenza, gastroenteritis, dehydration, electrolyte disarray  Patient is awake and alert, hemodynamically stable and afebrile.  She is nontoxic in appearance.  Further workup is indicated.  IV was established and labs were obtained.  Labs reveal a leukocytosis to 15, hypokalemia to 2.9.  Her BUN to creatinine ratio is not highly suggestive of volume depletion.  Potassium was repleted with p.o. potassium as patient did not wish to wait for IV.  Chest x-ray suspicious for a right middle lobe pneumonia.  Patient is out of the window for treatment with Tamiflu, though she was started on antibiotics for pneumonia.  Patient was able to tolerate p.o.  We discussed return precautions and outpatient follow-up.  Patient understands and agrees with plan.  She was discharged in stable condition.   Patient's presentation is most consistent with acute complicated illness / injury requiring diagnostic workup.    FINAL CLINICAL IMPRESSION(S) / ED DIAGNOSES   Final diagnoses:  Influenza A  Pneumonia of right middle lobe due to infectious organism  Hypokalemia  Nausea vomiting and diarrhea     Rx / DC Orders   ED Discharge Orders          Ordered    cefdinir  (OMNICEF ) 300 MG capsule  2 times daily        10/21/24 1344    azithromycin  (ZITHROMAX  Z-PAK) 250 MG tablet        10/21/24 1344             Note:  This document was prepared using Dragon voice recognition software and may include unintentional dictation errors.   Tuwana Kapaun E, PA-C 10/21/24 1430    Dicky Anes, MD 10/21/24 1547  "

## 2024-10-21 NOTE — Discharge Instructions (Addendum)
 You were found to have influenza and pneumonia.  Please take the antibiotics as prescribed for pneumonia.  Please return for any new, worsening, or changing symptoms or other concerns.  It was a pleasure caring for you today.

## 2024-10-21 NOTE — ED Triage Notes (Signed)
 Pt presents to the ED via POV from home. Pt reports emesis x6 days. States that she has a double ear infection, but states that she is unable to keep down the antibiotic. Pt reports some diarrhea. Reports productive cough and fevers.   Pt examined by Saunders, PA at time of triage.
